# Patient Record
Sex: Male | Born: 1970 | Race: Black or African American | Hispanic: No | Marital: Married | Smoking: Never smoker
Health system: Southern US, Community
[De-identification: ages and names within clinical notes are randomized; demographics above are authoritative.]

---

## 2013-01-02 ENCOUNTER — Ambulatory Visit (INDEPENDENT_AMBULATORY_CARE_PROVIDER_SITE_OTHER): Payer: Self-pay | Admitting: Podiatry

## 2013-01-02 ENCOUNTER — Encounter: Payer: Self-pay | Admitting: Podiatry

## 2013-01-02 VITALS — BP 132/70 | HR 76 | Ht 66.0 in | Wt 253.0 lb

## 2013-01-02 DIAGNOSIS — M7750 Other enthesopathy of unspecified foot: Secondary | ICD-10-CM

## 2013-01-02 DIAGNOSIS — M722 Plantar fascial fibromatosis: Secondary | ICD-10-CM | POA: Insufficient documentation

## 2013-01-02 DIAGNOSIS — M659 Synovitis and tenosynovitis, unspecified: Secondary | ICD-10-CM

## 2013-01-02 DIAGNOSIS — M7741 Metatarsalgia, right foot: Secondary | ICD-10-CM

## 2013-01-02 MED ORDER — NABUMETONE 500 MG PO TABS: 500.0000 mg | ORAL_TABLET | Freq: Two times a day (BID) | ORAL | Status: AC

## 2013-01-02 NOTE — Progress Notes (Signed)
Pain at instep, medial and lateral ankle, plus lateral column of both feet x 1 month. Last 2 weeks has been really bad. Especially after been sitting and stand back up, hurts a lot.  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  Starts hurt after 1:00pm and when gets home, after sitting for a while.  In the morning getting out of bed is really bad.   Objective: Excess motion at the first ray right.  Neurovascular status are within normal. Dermatologic findings are normal. Radiographic findings show dorsiflexed first ray bilateral, long first metatarsal bone, lateral deviation of the forefoot.  Assessment: Tenosynovitis of ankle and rearfoot right>left. Lesser Metatarsalgia right>left. 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  Plan: Reviewed the findings. Night Splint dispensed x 2. Compounding cream sample dispensed. Rx. Relafen done. Return for Orthotics.

## 2013-01-02 NOTE — Patient Instructions (Signed)
Seen for painful feet. Assessment was made as Plantar fasciitis, Metatarsalgia, and Tenosynovitis right>left secondary to abnormal biomechanics of foot. Will use NSAIA (Relafen) and Night Splint for now. Return to have Orthotics made.

## 2013-01-11 ENCOUNTER — Ambulatory Visit: Payer: Self-pay | Admitting: Podiatry

## 2018-05-29 ENCOUNTER — Other Ambulatory Visit: Payer: Self-pay

## 2018-05-29 ENCOUNTER — Emergency Department (HOSPITAL_COMMUNITY): Payer: Self-pay

## 2018-05-29 ENCOUNTER — Encounter (HOSPITAL_COMMUNITY): Payer: Self-pay

## 2018-05-29 ENCOUNTER — Emergency Department (HOSPITAL_COMMUNITY)
Admission: EM | Admit: 2018-05-29 | Discharge: 2018-05-30 | Disposition: A | Discharge status: Home / Self Care | Payer: Self-pay | Attending: Emergency Medicine | Admitting: Emergency Medicine

## 2018-05-29 DIAGNOSIS — N289 Disorder of kidney and ureter, unspecified: Secondary | ICD-10-CM

## 2018-05-29 DIAGNOSIS — N189 Chronic kidney disease, unspecified: Secondary | ICD-10-CM | POA: Insufficient documentation

## 2018-05-29 DIAGNOSIS — N200 Calculus of kidney: Secondary | ICD-10-CM | POA: Insufficient documentation

## 2018-05-29 DIAGNOSIS — Z79899 Other long term (current) drug therapy: Secondary | ICD-10-CM | POA: Insufficient documentation

## 2018-05-29 LAB — URINALYSIS, ROUTINE W REFLEX MICROSCOPIC
Bacteria, UA: NONE SEEN
Bilirubin Urine: NEGATIVE
GLUCOSE, UA: NEGATIVE mg/dL
HGB URINE DIPSTICK: NEGATIVE
Ketones, ur: 5 mg/dL — AB
Leukocytes,Ua: NEGATIVE
Nitrite: NEGATIVE
PROTEIN: 30 mg/dL — AB
Specific Gravity, Urine: 1.023 (ref 1.005–1.030)
pH: 7 (ref 5.0–8.0)

## 2018-05-29 LAB — COMPREHENSIVE METABOLIC PANEL
ALT: 19 U/L (ref 0–44)
AST: 15 U/L (ref 15–41)
Albumin: 3.9 g/dL (ref 3.5–5.0)
Alkaline Phosphatase: 76 U/L (ref 38–126)
Anion gap: 9 (ref 5–15)
BUN: 16 mg/dL (ref 6–20)
CO2: 25 mmol/L (ref 22–32)
CREATININE: 2.14 mg/dL — AB (ref 0.61–1.24)
Calcium: 9 mg/dL (ref 8.9–10.3)
Chloride: 101 mmol/L (ref 98–111)
GFR calc Af Amer: 41 mL/min — ABNORMAL LOW (ref >60)
GFR calc non Af Amer: 36 mL/min — ABNORMAL LOW (ref >60)
Glucose, Bld: 104 mg/dL — ABNORMAL HIGH (ref 70–99)
Potassium: 3.9 mmol/L (ref 3.5–5.1)
Sodium: 135 mmol/L (ref 135–145)
Total Bilirubin: 0.7 mg/dL (ref 0.3–1.2)
Total Protein: 8.5 g/dL — ABNORMAL HIGH (ref 6.5–8.1)

## 2018-05-29 LAB — CBC WITH DIFFERENTIAL/PLATELET
Abs Immature Granulocytes: 0.08 10*3/uL — ABNORMAL HIGH (ref 0.00–0.07)
Basophils Absolute: 0.1 10*3/uL (ref 0.0–0.1)
Basophils Relative: 0 %
Eosinophils Absolute: 0 10*3/uL (ref 0.0–0.5)
Eosinophils Relative: 0 %
HCT: 48.6 % (ref 39.0–52.0)
Hemoglobin: 16 g/dL (ref 13.0–17.0)
Immature Granulocytes: 1 %
Lymphocytes Relative: 8 %
Lymphs Abs: 1.4 10*3/uL (ref 0.7–4.0)
MCH: 28.5 pg (ref 26.0–34.0)
MCHC: 32.9 g/dL (ref 30.0–36.0)
MCV: 86.6 fL (ref 80.0–100.0)
Monocytes Absolute: 1.3 10*3/uL — ABNORMAL HIGH (ref 0.1–1.0)
Monocytes Relative: 8 %
Neutro Abs: 13.8 10*3/uL — ABNORMAL HIGH (ref 1.7–7.7)
Neutrophils Relative %: 83 %
Platelets: 276 10*3/uL (ref 150–400)
RBC: 5.61 MIL/uL (ref 4.22–5.81)
RDW: 13.6 % (ref 11.5–15.5)
WBC: 16.6 10*3/uL — ABNORMAL HIGH (ref 4.0–10.5)
nRBC: 0 % (ref 0.0–0.2)

## 2018-05-29 LAB — LIPASE, BLOOD: Lipase: 29 U/L (ref 11–51)

## 2018-05-29 MED ORDER — SODIUM CHLORIDE 0.9 % IV SOLN
INTRAVENOUS | Status: DC
  Administered 2018-05-29: 20 mL/h via INTRAVENOUS

## 2018-05-29 NOTE — ED Triage Notes (Signed)
Pt reports lower abdominal pain that radiates around to his L flank since last Saturday that is getting progressively worse. Denies N/V/D. Pt states that he thinks that it is a reaction too "bad medicine." A&Ox4.

## 2018-05-29 NOTE — Discharge Instructions (Signed)
You have a 3 to 4 mm left-sided kidney stone with mild hydronephrosis.  You also have an elevated creatinine up to 2.  This may be related to your kidney stone and you need to call the urologist in the morning to schedule a follow-up visit.  I have also given you a referral to see a kidney specialist and please call them for follow-up as well.  Use a liquid diet for the next several days.  You may also use an enema or magnesium citrate for constipation

## 2018-05-29 NOTE — ED Notes (Signed)
Spoke to [facility] in the lab regarding UA delay (only showing collected not in process) he sts they have received it and it should result within next 10 minutes.

## 2018-05-29 NOTE — ED Notes (Signed)
MD At bedside

## 2018-05-29 NOTE — ED Provider Notes (Addendum)
[facility]-EMERGENCY DEPT Provider Note   CSN: [id number] Arrival date & time: 05/29/18  1918    History   Chief Complaint No chief complaint on file.   HPI David Zimmerman is a 48 y.o. male.     48 year old male presents with several days of abdominal discomfort with associated constipation.  States that he has been moved his bowels and several days.  Symptoms are worse when he drinks liquids and states he gets severe cramping.  Denies any fever or emesis.  No dysuria or hematuria.  Has been using anti-gas medication without relief.  Patient had been on N-acetylcysteine for congestion.  Was seen in urgent care for similar symptoms as well as cough or congestion.  He denies any dyspnea at this time.  Was told to come to the ER if his symptoms worsen      History reviewed. No pertinent past medical history.  Patient Active Problem List   Diagnosis Date Noted  . Plantar fasciitis 01/02/2013  . Tenosynovitis of foot and ankle 01/02/2013  . Metatarsalgia of both feet 01/02/2013    History reviewed. No pertinent surgical history.      Home Medications    Prior to Admission medications   Medication Sig Start Date End Date Taking? Authorizing Provider  nabumetone (RELAFEN) 500 MG tablet Take 1 tablet (500 mg total) by mouth 2 (two) times daily. 01/02/13   Sheard, Joline Maxcy, DPM    Family History History reviewed. No pertinent family history.  Social History Social History   Tobacco Use  . Smoking status: Never Smoker  . Smokeless tobacco: Never Used  Substance Use Topics  . Alcohol use: Not on file  . Drug use: Not on file     Allergies   Patient has no known allergies.   Review of Systems Review of Systems  All other systems reviewed and are negative.    Physical Exam Updated Vital Signs BP (!) 144/88   Pulse 83   Temp 98.9 F (37.2 C) (Oral)   Resp 16   SpO2 96%   Physical Exam Vitals signs and nursing note reviewed.   Constitutional:      General: He is not in acute distress.    Appearance: Normal appearance. He is well-developed. He is not toxic-appearing.  HENT:     Head: Normocephalic and atraumatic.  Eyes:     General: Lids are normal.     Conjunctiva/sclera: Conjunctivae normal.     Pupils: Pupils are equal, round, and reactive to light.  Neck:     Musculoskeletal: Normal range of motion and neck supple.     Thyroid: No thyroid mass.     Trachea: No tracheal deviation.  Cardiovascular:     Rate and Rhythm: Normal rate and regular rhythm.     Heart sounds: Normal heart sounds. No murmur. No gallop.   Pulmonary:     Effort: Pulmonary effort is normal. No respiratory distress.     Breath sounds: Normal breath sounds. No stridor. No decreased breath sounds, wheezing, rhonchi or rales.  Abdominal:     General: Bowel sounds are normal. There is distension.     Palpations: Abdomen is soft.     Tenderness: There is generalized abdominal tenderness. There is no guarding or rebound.  Musculoskeletal: Normal range of motion.        General: No tenderness.  Skin:    General: Skin is warm and dry.     Findings: No abrasion or rash.  Neurological:  Mental Status: He is alert and oriented to person, place, and time.     GCS: GCS eye subscore is 4. GCS verbal subscore is 5. GCS motor subscore is 6.     Cranial Nerves: No cranial nerve deficit.     Sensory: No sensory deficit.  Psychiatric:        Speech: Speech normal.        Behavior: Behavior normal.      ED Treatments / Results  Labs (all labs ordered are listed, but only abnormal results are displayed) Labs Reviewed  URINALYSIS, ROUTINE W REFLEX MICROSCOPIC    EKG None  Radiology No results found.  Procedures Procedures (including critical care time)  Medications Ordered in ED Medications - No data to display   Initial Impression / Assessment and Plan / ED Course  I have reviewed the triage vital signs and the nursing  notes.  Pertinent labs & imaging results that were available during my care of the patient were reviewed by me and considered in my medical decision making (see chart for details).       Patient's pain appears to be controlled at this time. Urinalysis negative.  Abdominal CT positive for left-sided kidney stone with mild hydronephrosis.  Also shows an ileus.  Patient has evidence of renal insufficiency with creatinine of 2.  He has no prior value to compare this to.  Might be related to his kidney stone on the left side.  Patient's pain control at this time.  Give patient referral to urology on-call as well as instruct him to be on a liquid diet for the next 3 to 5 days.  Also instructed about the importance to follow-up for his elevated creatinine  Final Clinical Impressions(s) / ED Diagnoses   Final diagnoses:  None    ED Discharge Orders    None       Lorre Nick, MD 05/29/18 2328    Lorre Nick, MD 05/29/18 2332

## 2019-11-15 IMAGING — CT CT ABDOMEN AND PELVIS WITHOUT CONTRAST
2 of 4 series · 16 of 46 positions shown, 18 images · non-contrast
Comparison: Radiograph 05/29/2018

CLINICAL DATA: Abdomen distension

EXAM:
CT ABDOMEN AND PELVIS WITHOUT CONTRAST
TECHNIQUE: Multidetector CT imaging of the abdomen and pelvis was performed
following the standard protocol without IV contrast.

[Series 2: axial st · axial · 0.79mm/px · z∈[-299,+136]mm · 13 of 99 slices shown, 15 images]
[im 6/99  soft-tissue]
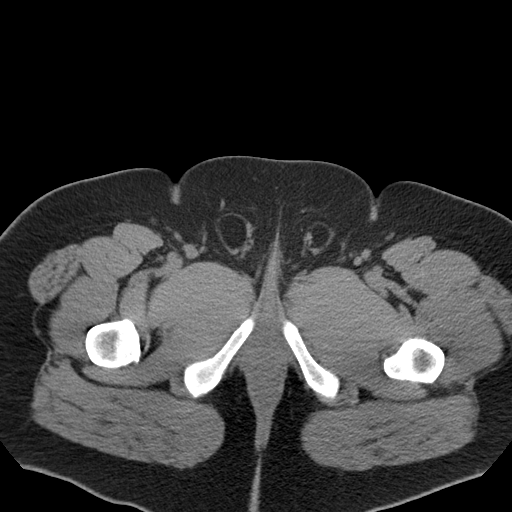
[im 6/99  bone]
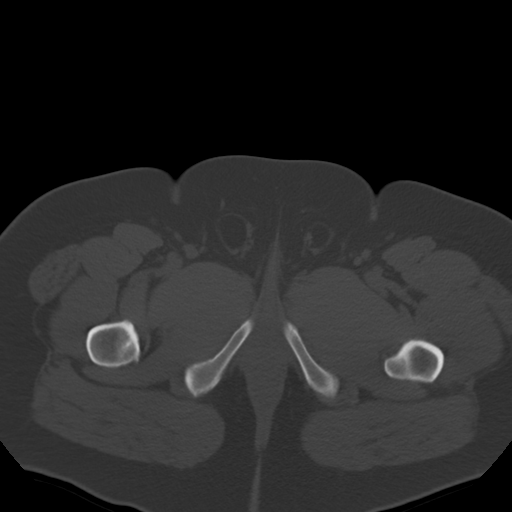
[im 16/99  soft-tissue]
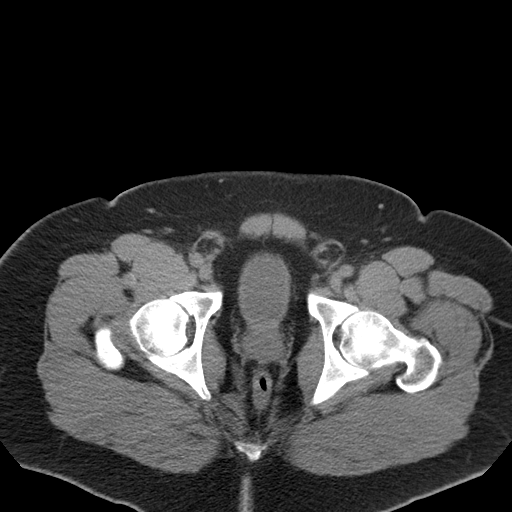
[im 21/99  soft-tissue]
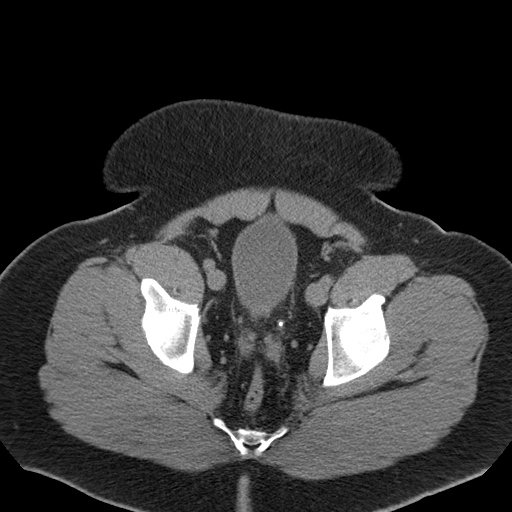
[im 26/99  soft-tissue]
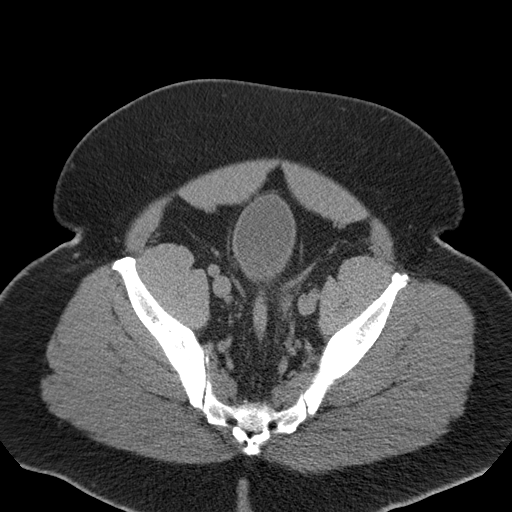
[im 37/99  soft-tissue]
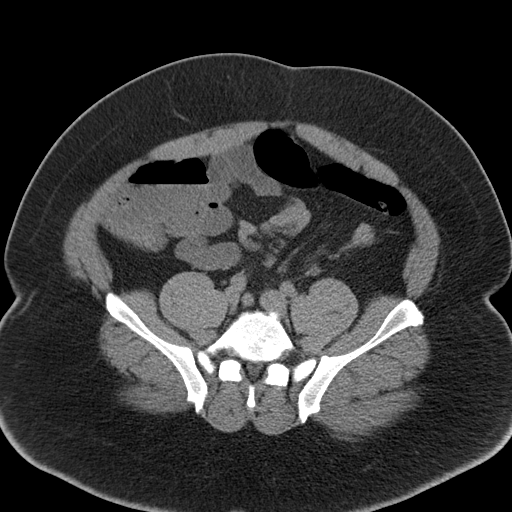
[im 42/99  soft-tissue]
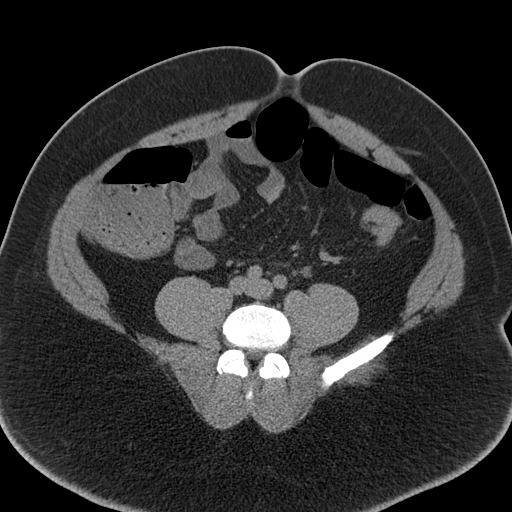
[im 52/99  soft-tissue]
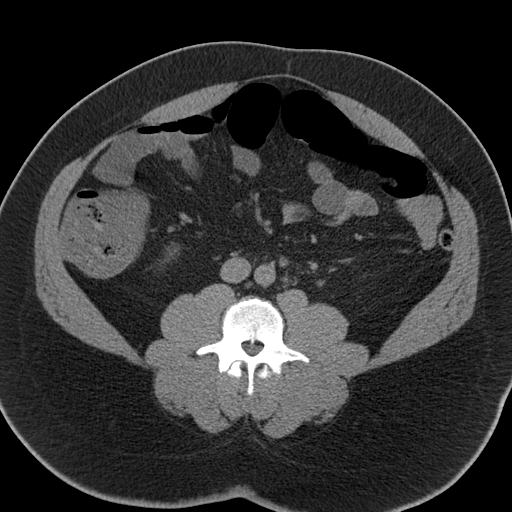
[im 57/99  soft-tissue]
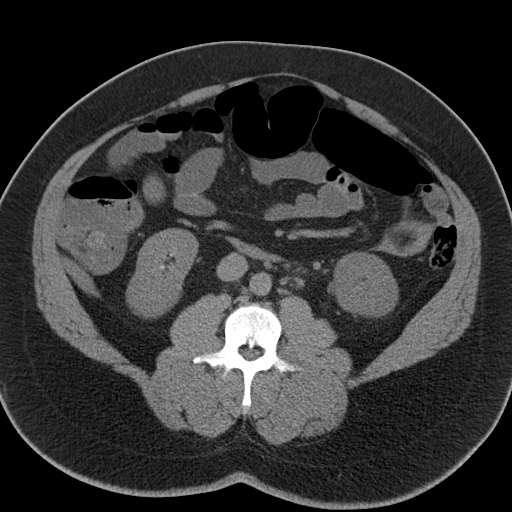
[im 62/99  soft-tissue]
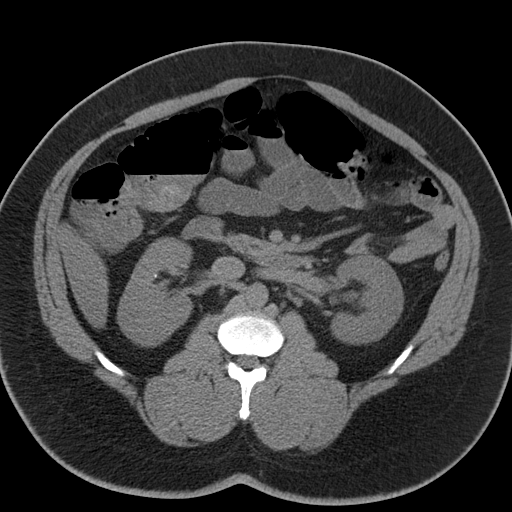
[im 62/99  bone]
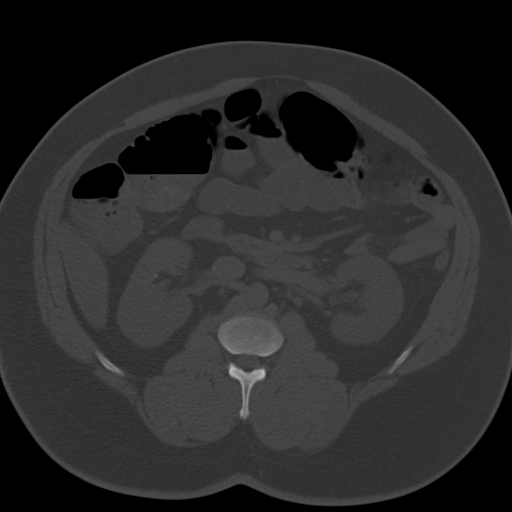
[im 73/99  soft-tissue]
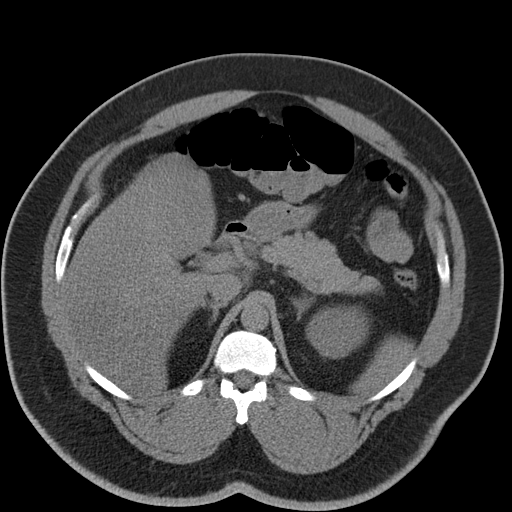
[im 78/99  soft-tissue]
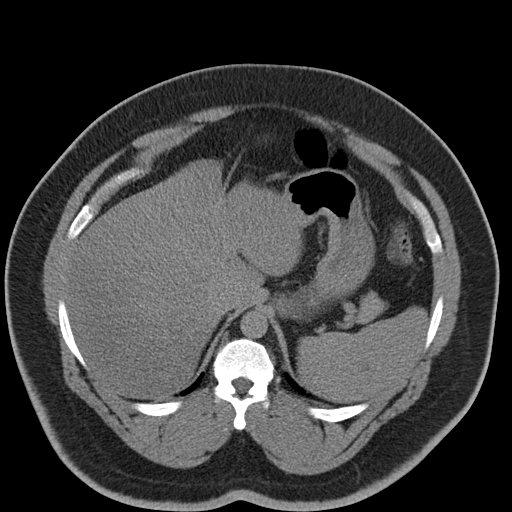
[im 83/99  soft-tissue]
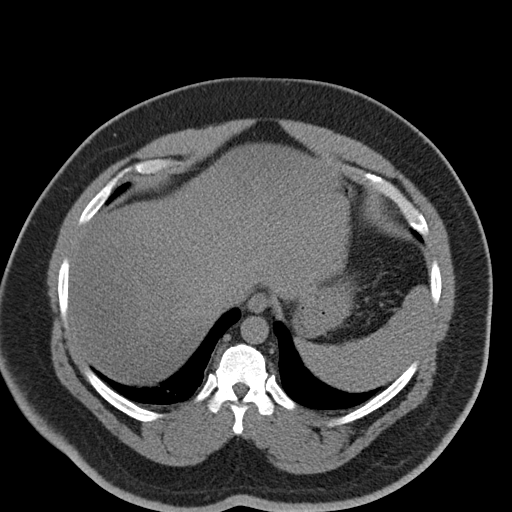
[im 93/99  soft-tissue]
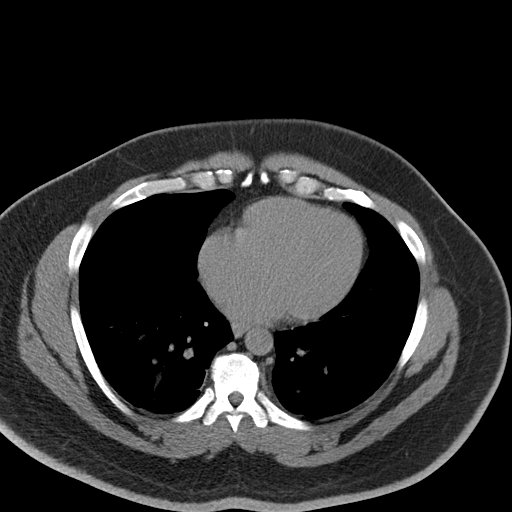

[Series 5: coronal st · coronal · 0.80mm/px · 3 of 154 slices shown]
[im 52/154  soft-tissue]
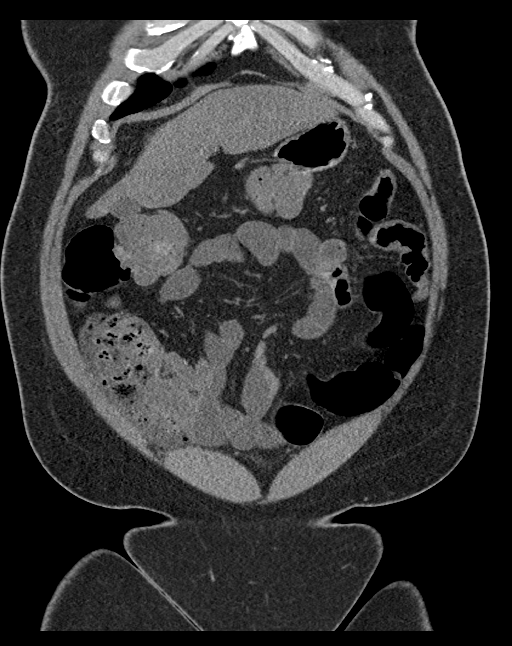
[im 69/154  soft-tissue]
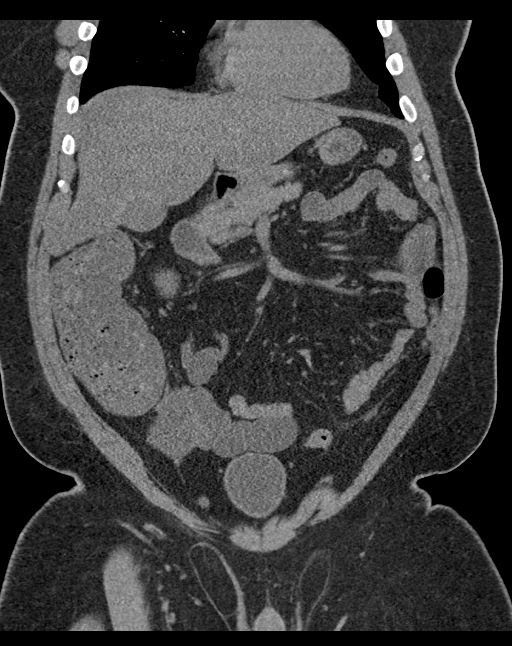
[im 86/154  soft-tissue]
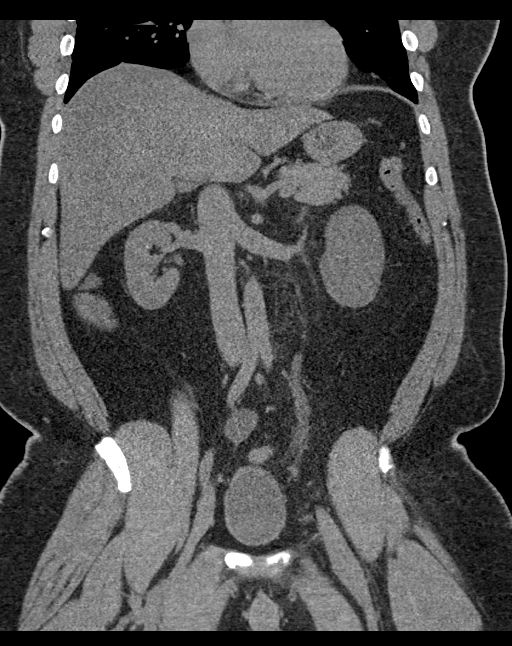

[16 of 46 positions shown; findings below may reference images not displayed]

FINDINGS: Lower chest: Lung bases demonstrate subsegmental atelectasis in the
lower lobes. No pleural effusion. Normal heart size.

Hepatobiliary: No focal liver abnormality is seen. No gallstones,
gallbladder wall thickening, or biliary dilatation.

Pancreas: Unremarkable. No pancreatic ductal dilatation or
surrounding inflammatory changes.

Spleen: Normal in size without focal abnormality.

Adrenals/Urinary Tract: Adrenal glands are normal. Multiple small
stones within the right greater than left kidneys. Mild left
hydronephrosis and hydroureter, secondary to a 3 mm stone in the
distal left ureter about 1.5 cm cephalad to the left UVJ.

Stomach/Bowel: The stomach is nonenlarged. Fluid-filled nondilated
small bowel. Liquid stools within the colon which is slightly air
distended. No colon wall thickening. Negative appendix.

Vascular/Lymphatic: No significant vascular findings are present. No
enlarged abdominal or pelvic lymph nodes.

Reproductive: Prostate is unremarkable.

Other: Fat within the inguinal canals.  No free air or free fluid

Musculoskeletal: No acute or significant osseous findings.
IMPRESSION: 1. Mild left hydronephrosis and hydroureter, secondary to a 3-4 mm
stone in the distal left ureter about 1.5 cm proximal to the left
UVJ.
2. Multiple small intrarenal stones bilaterally
3. Diffuse fluid-filled but nonenlarged small bowel with air and
fluid in the colon, suggesting an ileus or possible enteritis. No
evidence for an obstruction.
